# Patient Record
Sex: Male | Born: 1965 | Race: Black or African American | Hispanic: No | Marital: Married | Smoking: Never smoker
Health system: Southern US, Community
[De-identification: ages and names within clinical notes are randomized; demographics above are authoritative.]

---

## 2013-04-04 ENCOUNTER — Ambulatory Visit (INDEPENDENT_AMBULATORY_CARE_PROVIDER_SITE_OTHER): Payer: BC Managed Care – PPO | Admitting: Emergency Medicine

## 2013-04-04 VITALS — BP 112/74 | HR 55 | Temp 98.1°F | Resp 16 | Ht 71.0 in | Wt 182.0 lb

## 2013-04-04 DIAGNOSIS — S335XXA Sprain of ligaments of lumbar spine, initial encounter: Secondary | ICD-10-CM

## 2013-04-04 MED ORDER — NAPROXEN SODIUM 550 MG PO TABS: 550.0000 mg | ORAL_TABLET | Freq: Two times a day (BID) | ORAL | Status: AC

## 2013-04-04 MED ORDER — CYCLOBENZAPRINE HCL 10 MG PO TABS: 10.0000 mg | ORAL_TABLET | Freq: Three times a day (TID) | ORAL | Status: AC | PRN

## 2013-04-04 MED ORDER — HYDROCODONE-ACETAMINOPHEN 5-325 MG PO TABS: 1.0000 | ORAL_TABLET | ORAL | Status: AC | PRN

## 2013-04-04 NOTE — Patient Instructions (Addendum)

## 2013-04-04 NOTE — Progress Notes (Signed)
[facility] [address] [place] [address] [phone]  Date:  04/04/2013   Name:  Edward Vazquez   DOB:  November 14, 1966   MRN:  [id number]  PCP:  Benita Stabile, MD    Chief Complaint: Back Pain   History of Present Illness:  Edward Vazquez is a 47 y.o. 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  Since Monday has experienced pain in his left low back and left leg.  Numbness in left foot, especially great toe.  No history of injury.  No specific overuse.  Now cannot bend over to pull shoes on.  No improvement with over the counter medications or other home remedies. Denies other complaint or health concern today.   There is no problem list on file for this patient.   History reviewed. No pertinent past medical history.  History reviewed. No pertinent past surgical history.  History  Substance Use Topics  . Smoking status: Never Smoker   . Smokeless tobacco: Not on file  . Alcohol Use: No    History reviewed. No pertinent family history.  No Known Allergies  Medication list has been reviewed and updated.  No current outpatient prescriptions on file prior to visit.   No current facility-administered medications on file prior to visit.    Review of Systems:  As per HPI, otherwise negative.    Physical Examination: Filed Vitals:   04/04/13 1217  BP: 112/74  Pulse: 55  Temp: 98.1 F (36.7 C)  Resp: 16   Filed Vitals:   04/04/13 1217  Height: 5\' 11"  (1.803 m)  Weight: 182 lb (82.555 kg)   Body mass index is 25.4 kg/(m^2). Ideal Body Weight: Weight in (lb) to have BMI = 25: 178.9   GEN: WDWN, NAD, Non-toxic, Alert & Oriented x 3 HEENT: Atraumatic, Normocephalic.  Ears and Nose: No external deformity. EXTR: No clubbing/cyanosis/edema NEURO: Normal gait.  PSYCH: Normally interactive. Conversant. Not depressed or anxious appearing.  Calm demeanor.  BACK:  Tender  left lumbar para spinous muscles and left sciatic notch.  Assessment and Plan: Lumbar strain Anaprox Flexeril vicodin   Signed,  Phillips Odor, MD

## 2013-04-08 ENCOUNTER — Telehealth: Payer: Self-pay

## 2013-04-08 NOTE — Telephone Encounter (Signed)
No, he can not take 2 of these one only. He can try heat/ ice /rest with this.

## 2013-04-08 NOTE — Telephone Encounter (Signed)
PT STATES HE WAS GIVEN SOME FLEXERIL AND IT DOESN'T SEEM TO HELP. WOULD LIKE TO KNOW IF HE COULD TAKE 2 AT A TIME PLEASE CALL [phone]

## 2013-11-19 ENCOUNTER — Ambulatory Visit
Admission: RE | Admit: 2013-11-19 | Discharge: 2013-11-19 | Disposition: A | Discharge status: Home / Self Care | Payer: BC Managed Care – PPO | Source: Ambulatory Visit | Attending: Physician Assistant | Admitting: Physician Assistant

## 2013-11-19 ENCOUNTER — Other Ambulatory Visit: Payer: Self-pay | Admitting: Physician Assistant

## 2013-11-19 DIAGNOSIS — M25551 Pain in right hip: Secondary | ICD-10-CM

## 2014-06-26 ENCOUNTER — Other Ambulatory Visit: Payer: Self-pay | Admitting: Physician Assistant

## 2014-06-26 ENCOUNTER — Ambulatory Visit
Admission: RE | Admit: 2014-06-26 | Discharge: 2014-06-26 | Disposition: A | Discharge status: Home / Self Care | Payer: BC Managed Care – PPO | Source: Ambulatory Visit | Attending: Physician Assistant | Admitting: Physician Assistant

## 2014-06-26 DIAGNOSIS — T1490XA Injury, unspecified, initial encounter: Secondary | ICD-10-CM

## 2015-08-18 ENCOUNTER — Other Ambulatory Visit: Payer: Self-pay | Admitting: Physician Assistant

## 2015-08-18 ENCOUNTER — Ambulatory Visit
Admission: RE | Admit: 2015-08-18 | Discharge: 2015-08-18 | Disposition: A | Discharge status: Home / Self Care | Payer: BC Managed Care – PPO | Source: Ambulatory Visit | Attending: Physician Assistant | Admitting: Physician Assistant

## 2015-08-18 DIAGNOSIS — R52 Pain, unspecified: Secondary | ICD-10-CM

## 2016-06-08 ENCOUNTER — Ambulatory Visit
Admission: RE | Admit: 2016-06-08 | Discharge: 2016-06-08 | Disposition: A | Discharge status: Home / Self Care | Payer: BC Managed Care – PPO | Source: Ambulatory Visit | Attending: Physician Assistant | Admitting: Physician Assistant

## 2016-06-08 ENCOUNTER — Other Ambulatory Visit: Payer: Self-pay | Admitting: Physician Assistant

## 2016-06-08 DIAGNOSIS — T1490XA Injury, unspecified, initial encounter: Secondary | ICD-10-CM

## 2016-08-02 ENCOUNTER — Encounter (HOSPITAL_BASED_OUTPATIENT_CLINIC_OR_DEPARTMENT_OTHER): Payer: Self-pay | Admitting: *Deleted

## 2016-08-02 ENCOUNTER — Emergency Department (HOSPITAL_BASED_OUTPATIENT_CLINIC_OR_DEPARTMENT_OTHER)
Admission: EM | Admit: 2016-08-02 | Discharge: 2016-08-03 | Disposition: A | Discharge status: Home / Self Care | Payer: BC Managed Care – PPO | Attending: Emergency Medicine | Admitting: Emergency Medicine

## 2016-08-02 DIAGNOSIS — S86802A Unspecified injury of other muscle(s) and tendon(s) at lower leg level, left leg, initial encounter: Secondary | ICD-10-CM | POA: Diagnosis not present

## 2016-08-02 DIAGNOSIS — Y999 Unspecified external cause status: Secondary | ICD-10-CM | POA: Diagnosis not present

## 2016-08-02 DIAGNOSIS — S8992XA Unspecified injury of left lower leg, initial encounter: Secondary | ICD-10-CM | POA: Diagnosis present

## 2016-08-02 DIAGNOSIS — W2103XA Struck by baseball, initial encounter: Secondary | ICD-10-CM | POA: Insufficient documentation

## 2016-08-02 DIAGNOSIS — Y9364 Activity, baseball: Secondary | ICD-10-CM | POA: Diagnosis not present

## 2016-08-02 DIAGNOSIS — Y929 Unspecified place or not applicable: Secondary | ICD-10-CM | POA: Diagnosis not present

## 2016-08-02 DIAGNOSIS — IMO0001 Reserved for inherently not codable concepts without codable children: Secondary | ICD-10-CM

## 2016-08-02 NOTE — ED Triage Notes (Signed)
He was running while playing baseball tonight. He felt a pop and his left calf causing him to fall. Swelling and pain.

## 2016-08-03 MED ORDER — KETOROLAC TROMETHAMINE 60 MG/2ML IM SOLN
60.0000 mg | Freq: Once | INTRAMUSCULAR | Status: AC
  Administered 2016-08-03: 60 mg via INTRAMUSCULAR
  Filled 2016-08-03: qty 2

## 2016-08-03 MED ORDER — METHOCARBAMOL 500 MG PO TABS
1000.0000 mg | ORAL_TABLET | Freq: Once | ORAL | Status: AC
  Administered 2016-08-03: 1000 mg via ORAL
  Filled 2016-08-03: qty 2

## 2016-08-03 MED ORDER — METHOCARBAMOL 500 MG PO TABS: 500.0000 mg | ORAL_TABLET | Freq: Two times a day (BID) | ORAL | 0 refills | Status: AC

## 2016-08-03 MED ORDER — NAPROXEN 500 MG PO TABS: 500.0000 mg | ORAL_TABLET | Freq: Two times a day (BID) | ORAL | 0 refills | Status: AC

## 2016-08-03 MED ORDER — OXYCODONE-ACETAMINOPHEN 5-325 MG PO TABS
2.0000 | ORAL_TABLET | Freq: Once | ORAL | Status: AC
  Administered 2016-08-03: 2 via ORAL
  Filled 2016-08-03: qty 2

## 2016-08-03 MED ORDER — OXYCODONE-ACETAMINOPHEN 5-325 MG PO TABS: 1.0000 | ORAL_TABLET | Freq: Four times a day (QID) | ORAL | 0 refills | Status: AC | PRN

## 2016-08-03 NOTE — Discharge Instructions (Signed)
You have been seen today for a lower leg injury. We suspect that you have sustained a rupture in one of the small, unused muscles in the lower leg called the plantaris longus. Use the crutches as needed for comfort. Elevate the extremity whenever possible. Apply ice and use ibuprofen or naproxen to reduce inflammation.  Follow-up with orthopedics should symptoms fail to resolve. Return to ED as needed.

## 2016-08-03 NOTE — ED Provider Notes (Signed)
[facility] Note   CSN: [id number] Arrival date & time: 08/02/16  2154     History   Chief Complaint Chief Complaint  Patient presents with  . Leg Injury    HPI Edward QuintCurtis D Mullen is a 50 y.o. male.  HPI   Edward Vazquez is a 50 y.o. male, patient with no pertinent past medical history, presenting to the ED with Left lower leg injury that occurred just prior to arrival. Patient states he was playing baseball, pushed off from the base, and heard a pop in his left lower leg. Patient has pain in the same. Rates it moderate, achy and throbbing, nonradiating. Patient can still bear weight, but with pain. Denies loss of function, neuro deficits, head injury, or any other complaints or injuries.    History reviewed. No pertinent past medical history.  There are no active problems to display for this patient.   History reviewed. No pertinent surgical history.     Home Medications    Prior to Admission medications   Medication Sig Start Date End Date Taking? Authorizing Provider  cyclobenzaprine (FLEXERIL) 10 MG tablet Take 1 tablet (10 mg total) by mouth 3 (three) times daily as needed for muscle spasms. 04/04/13   Carmelina DaneJeffery S Anderson, MD  HYDROcodone-acetaminophen (NORCO) 5-325 MG per tablet Take 1 tablet by mouth every 4 (four) hours as needed. 04/04/13   Carmelina DaneJeffery S Anderson, MD  methocarbamol (ROBAXIN) 500 MG tablet Take 1 tablet (500 mg total) by mouth 2 (two) times daily. 08/03/16   Moshe Wenger C Danique Hartsough, PA-C  naproxen (NAPROSYN) 500 MG tablet Take 1 tablet (500 mg total) by mouth 2 (two) times daily. 08/03/16   Maxene Byington C Oline Belk, PA-C  oxyCODONE-acetaminophen (PERCOCET/ROXICET) 5-325 MG tablet Take 1-2 tablets by mouth every 6 (six) hours as needed for severe pain. 08/03/16   Anselm PancoastShawn C Casaundra Takacs, PA-C    Family History No family history on file.  Social History Social History  Substance Use Topics  . Smoking status: Never Smoker  . Smokeless tobacco: Never Used  .  Alcohol use No     Allergies   Review of patient's allergies indicates no known allergies.   Review of Systems Review of Systems  Musculoskeletal: Positive for myalgias. Negative for arthralgias and joint swelling.  Neurological: Negative for weakness and numbness.     Physical Exam Updated Vital Signs BP 102/77   Pulse 97   Temp 98.9 F (37.2 C) (Oral)   Resp 20   Ht 6' (1.829 m)   Wt 93 kg   SpO2 100%   BMI 27.80 kg/m   Physical Exam  Constitutional: He appears well-developed and well-nourished. No distress.  HENT:  Head: Normocephalic and atraumatic.  Eyes: Conjunctivae are normal.  Neck: Neck supple.  Cardiovascular: Normal rate and regular rhythm.   Pulmonary/Chest: Effort normal.  Musculoskeletal:  Tenderness and swelling to the left posterior lower leg. Patient has full range of motion in the bilateral knees and ankles. Achilles tendon appears to be intact.  Neurological: He is alert.  Skin: Skin is warm and dry. He is not diaphoretic.  Psychiatric: He has a normal mood and affect. His behavior is normal.  Nursing note and vitals reviewed.    ED Treatments / Results  Labs (all labs ordered are listed, but only abnormal results are displayed) Labs Reviewed - No data to display  EKG  EKG Interpretation None       Radiology No results found.  Procedures Procedures (including critical  care time)  Medications Ordered in ED Medications  oxyCODONE-acetaminophen (PERCOCET/ROXICET) 5-325 MG per tablet 2 tablet (2 tablets Oral Given 08/03/16 0007)  methocarbamol (ROBAXIN) tablet 1,000 mg (1,000 mg Oral Given 08/03/16 0008)  ketorolac (TORADOL) injection 60 mg (60 mg Intramuscular Given 08/03/16 0010)     Initial Impression / Assessment and Plan / ED Course  I have reviewed the triage vital signs and the nursing notes.  Pertinent labs & imaging results that were available during my care of the patient were reviewed by me and considered in my medical  decision making (see chart for details).  Clinical Course    Edward QuintCurtis D Gheen presents with a left lower leg injury that occurred just prior to arrival.  Findings and plan of care discussed with Paula LibraJohn Molpus, MD. Dr. Read DriversMolpus personally evaluated and examined this patient.  Suspect plantaris longus rupture. Treatment is symptomatic care. Follow up with orthopedics as needed. The patient was given instructions for home care as well as return precautions. Patient voices understanding of these instructions, accepts the plan, and is comfortable with discharge.   Vitals:   08/02/16 2202 08/02/16 2203 08/03/16 0033  BP:  102/77 121/88  Pulse:  97 62  Resp:  20 20  Temp:  98.9 F (37.2 C)   TempSrc:  Oral   SpO2:  100% 100%  Weight: 93 kg    Height: 6' (1.829 m)       Final Clinical Impressions(s) / ED Diagnoses   Final diagnoses:  Injury of plantaris muscle or tendon, left, initial encounter    New Prescriptions Discharge Medication List as of 08/03/2016 12:27 AM    START taking these medications   Details  methocarbamol (ROBAXIN) 500 MG tablet Take 1 tablet (500 mg total) by mouth 2 (two) times daily., Starting Wed 08/03/2016, Print    naproxen (NAPROSYN) 500 MG tablet Take 1 tablet (500 mg total) by mouth 2 (two) times daily., Starting Wed 08/03/2016, Print    oxyCODONE-acetaminophen (PERCOCET/ROXICET) 5-325 MG tablet Take 1-2 tablets by mouth every 6 (six) hours as needed for severe pain., Starting Wed 08/03/2016, Print         Anselm PancoastShawn C Wilfrido Luedke, PA-C 08/03/16 0211    Paula LibraJohn Molpus, MD 08/03/16 [phone]

## 2017-11-27 IMAGING — CR DG ANKLE COMPLETE 3+V*R*
3 series · 3 of 3 positions shown · non-contrast
Comparison: 06/26/2014 .

CLINICAL DATA: Right ankle injury.  Pain swelling.

EXAM:
RIGHT ANKLE - COMPLETE 3+ VIEW

[view not recorded (1 of 3)]
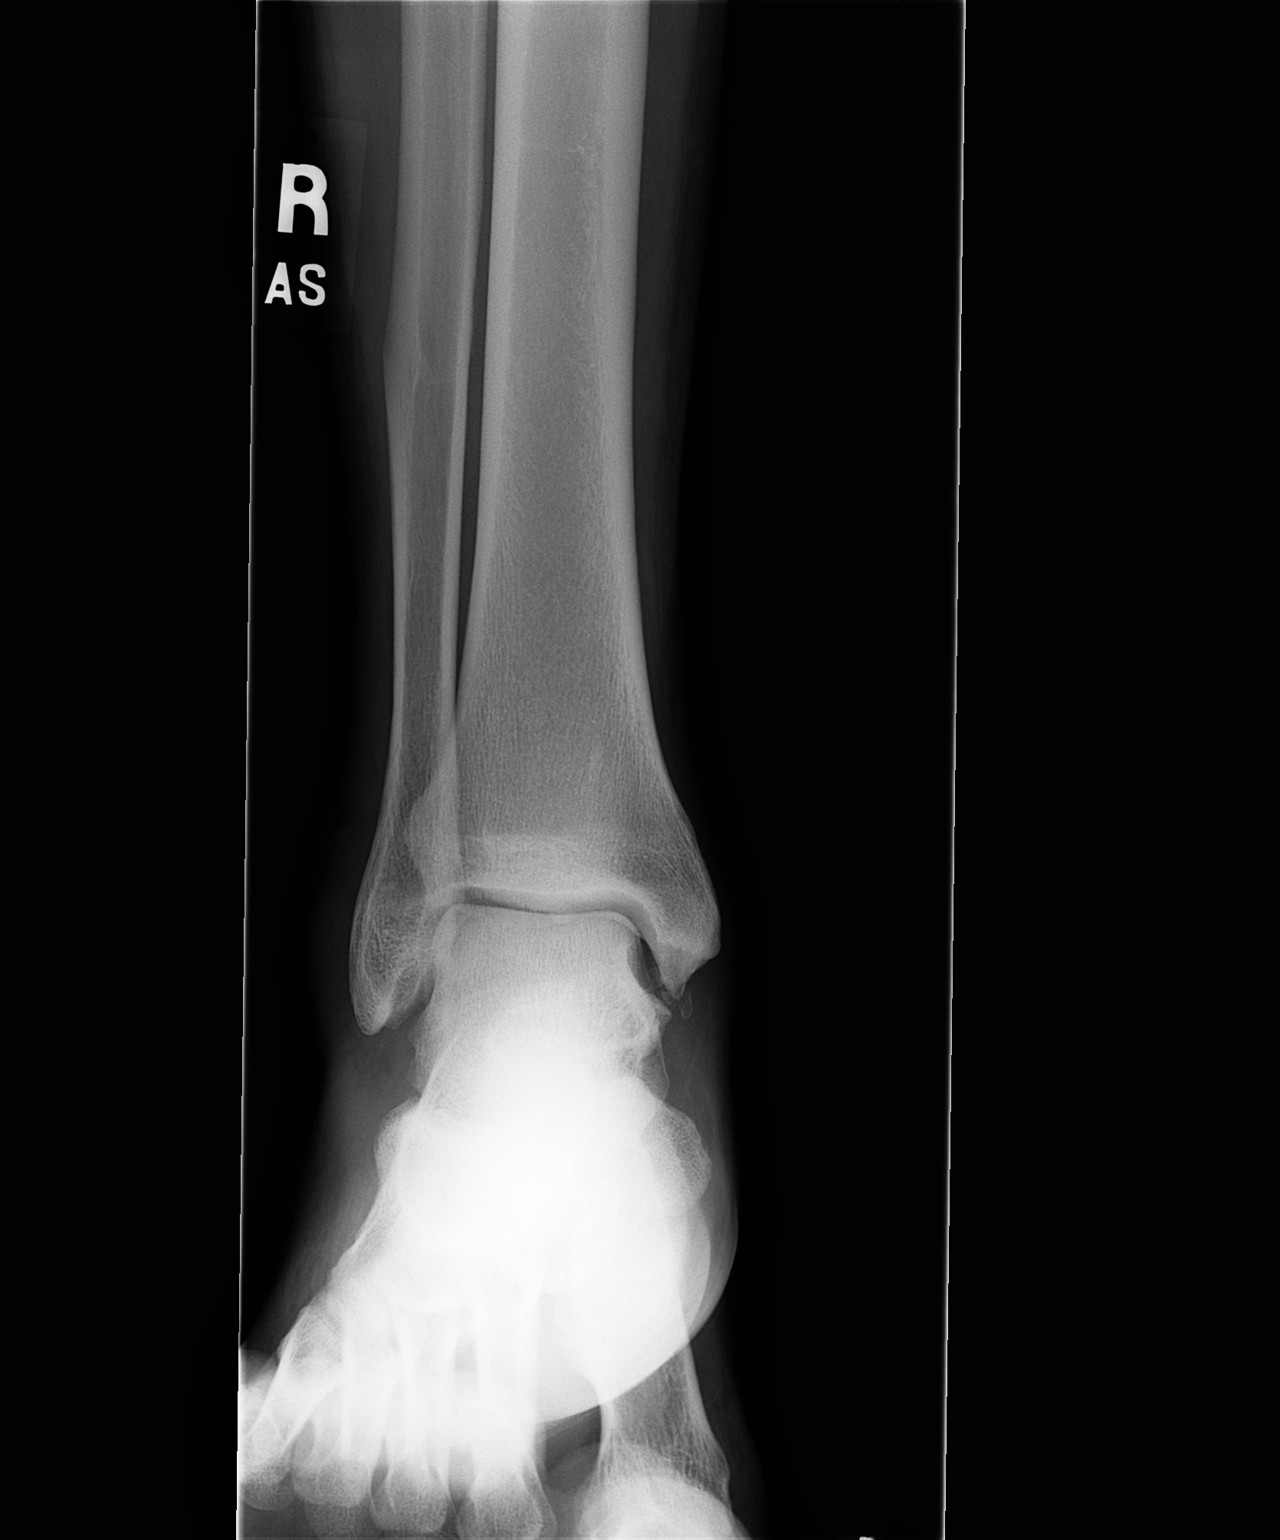

[view not recorded (2 of 3)]
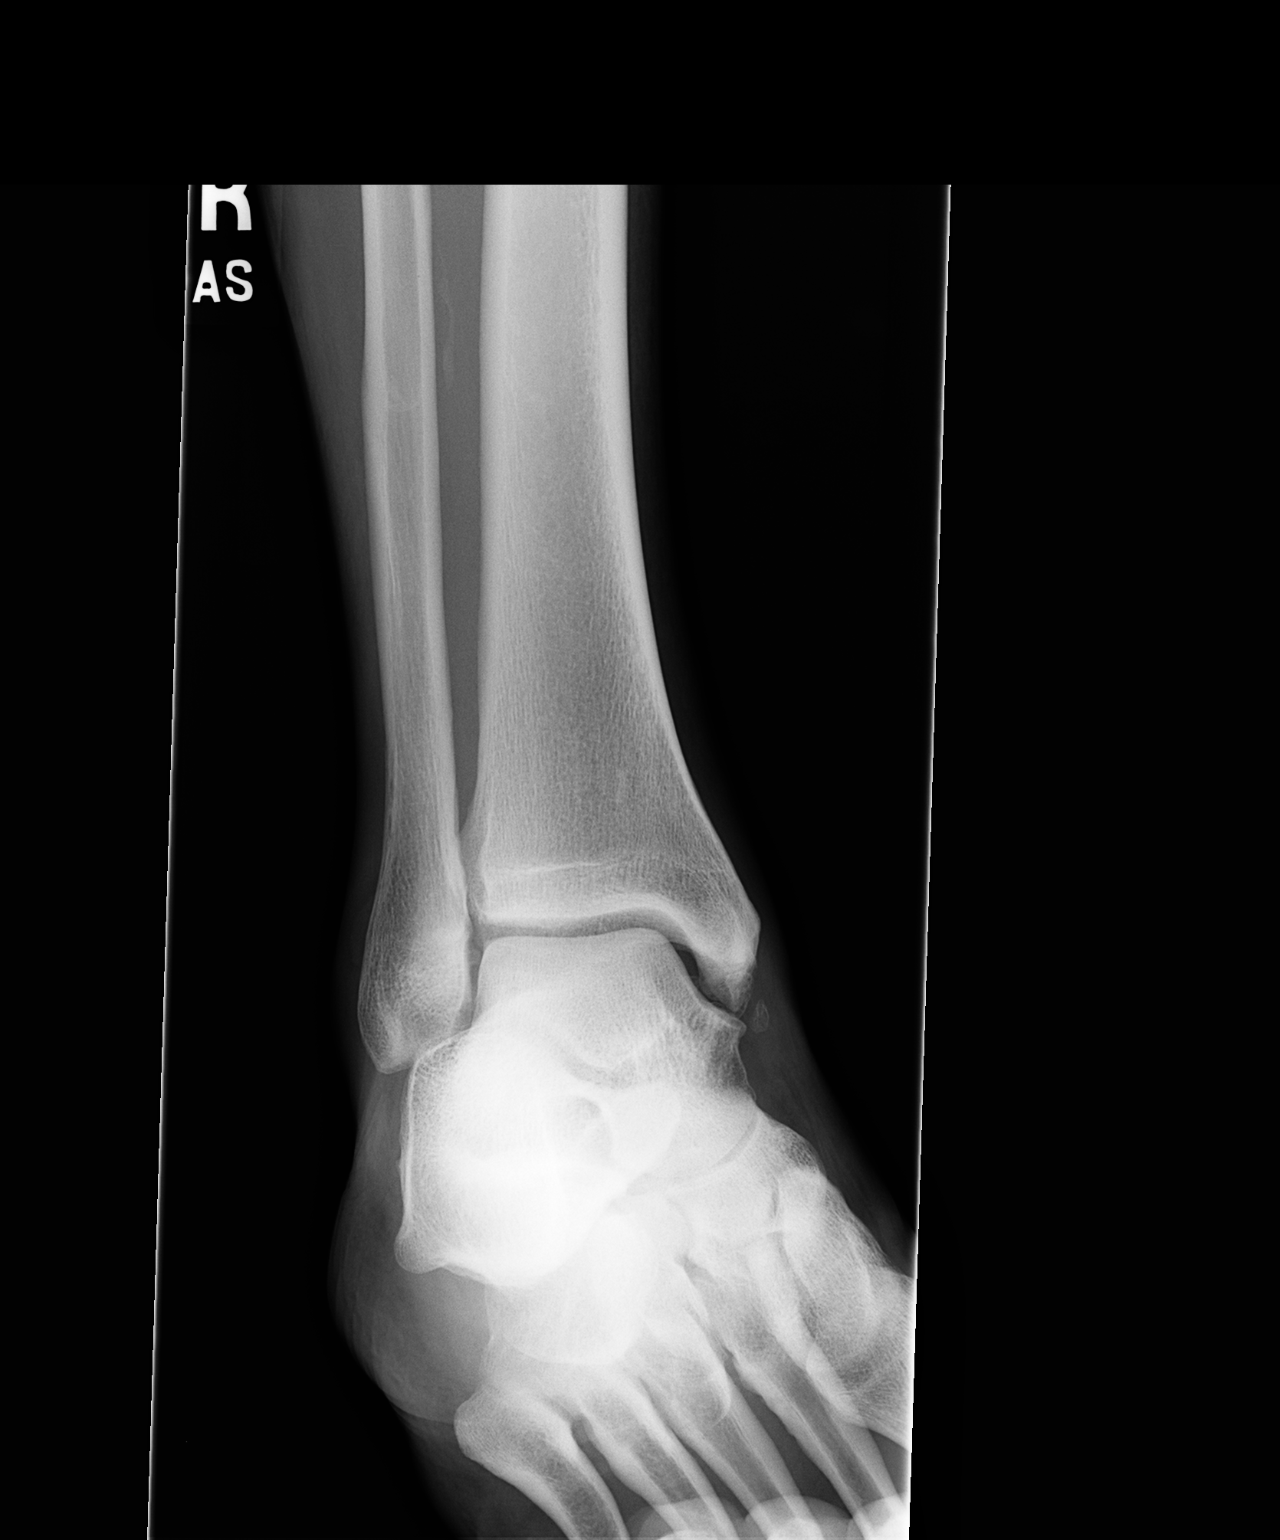

[view not recorded (3 of 3)]
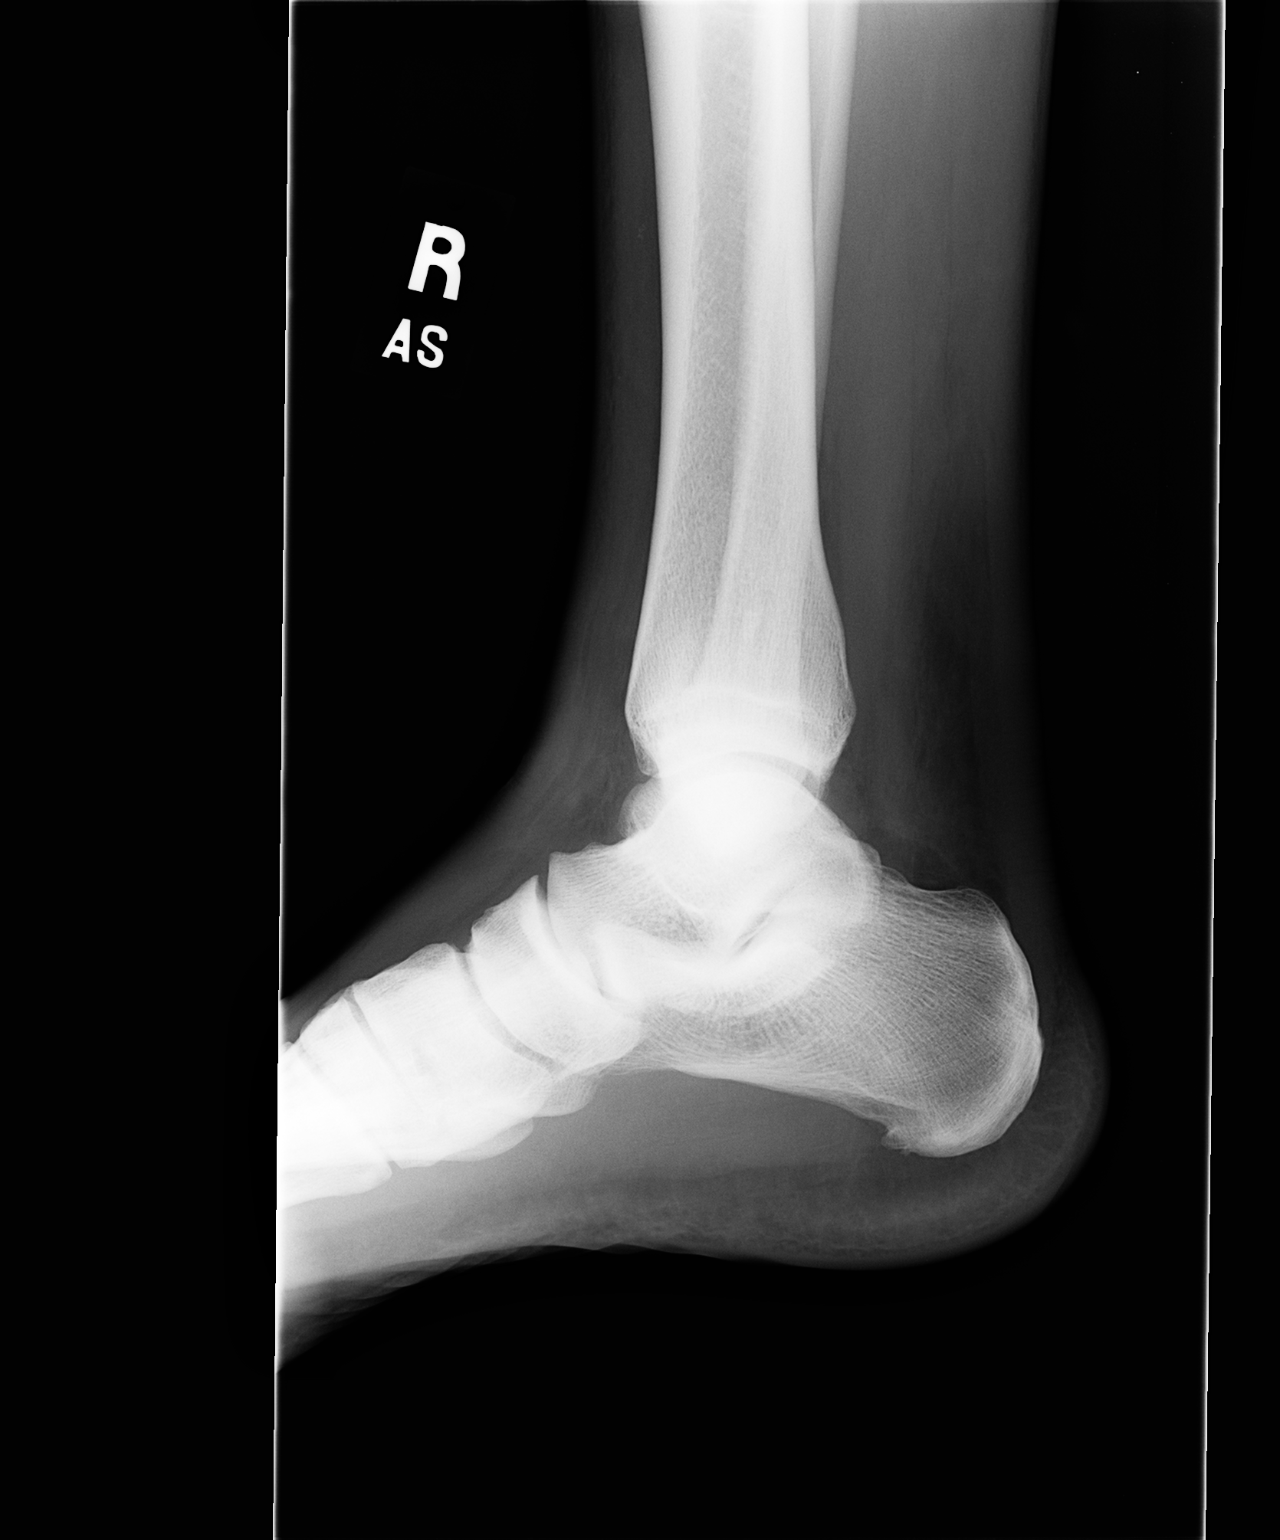

[3 of 3 positions shown; findings below may reference images not displayed]

FINDINGS: No acute bony or joint abnormality identified. Degenerative changes
noted about the right ankle. Tiny bony densities noted adjacent to
the medial malleolus most likely old fracture fragments .
IMPRESSION: No acute bony or joint abnormality identified. Tiny bony densities
noted adjacent to the medial malleolus most likely old fracture
fragments.

## 2021-09-24 ENCOUNTER — Other Ambulatory Visit: Payer: Self-pay | Admitting: Sports Medicine

## 2021-09-24 ENCOUNTER — Ambulatory Visit
Admission: RE | Admit: 2021-09-24 | Discharge: 2021-09-24 | Disposition: A | Discharge status: Home / Self Care | Payer: Self-pay | Source: Ambulatory Visit | Attending: Sports Medicine | Admitting: Sports Medicine

## 2021-09-24 DIAGNOSIS — M25512 Pain in left shoulder: Secondary | ICD-10-CM

## 2021-10-23 ENCOUNTER — Ambulatory Visit
Admission: RE | Admit: 2021-10-23 | Discharge: 2021-10-23 | Disposition: A | Discharge status: Home / Self Care | Payer: BC Managed Care – PPO | Source: Ambulatory Visit | Attending: Sports Medicine | Admitting: Sports Medicine

## 2021-10-23 DIAGNOSIS — M25512 Pain in left shoulder: Secondary | ICD-10-CM

## 2023-03-15 IMAGING — CR DG SHOULDER 2+V*L*
3 series · 3 of 3 positions shown · non-contrast
Comparison: None.

CLINICAL DATA: Lateral shoulder pain with limited range of motion
falling injury 2 weeks ago.

EXAM:
LEFT SHOULDER - 2+ VIEW

[w shoulder ap internal left]
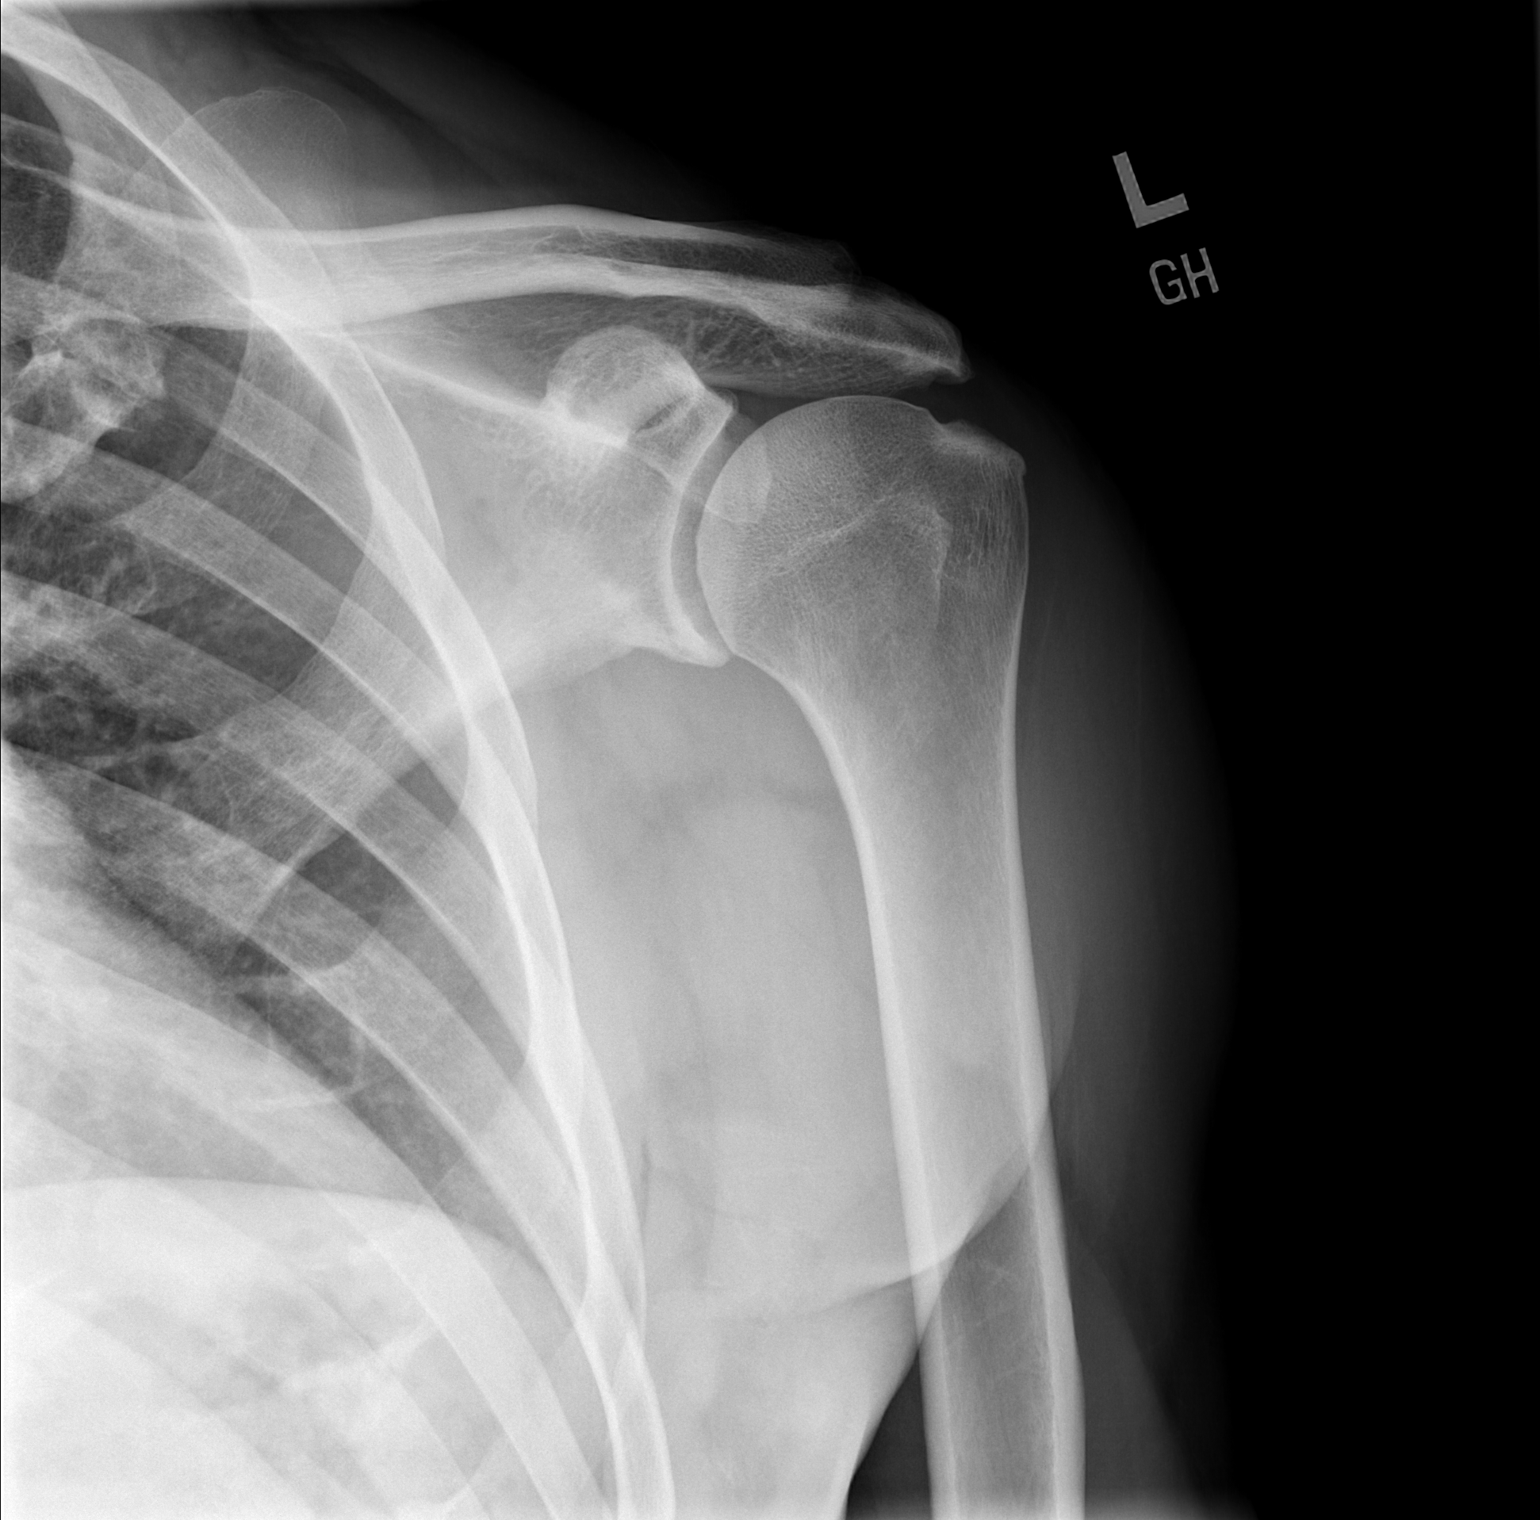

[w shoulder y view left]
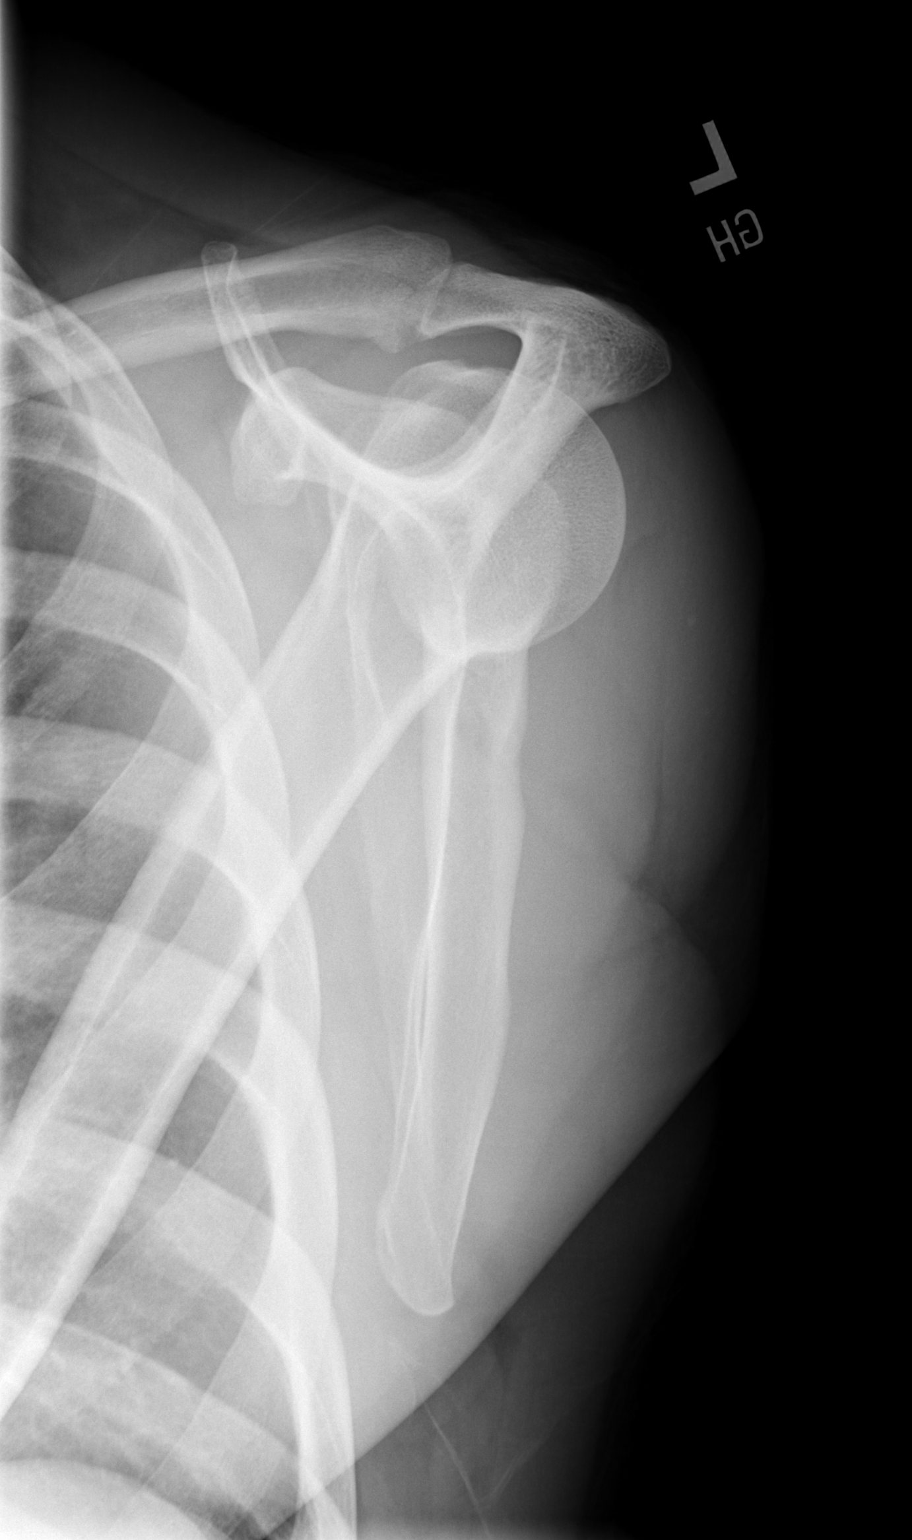

[w shoulder axillary left *]
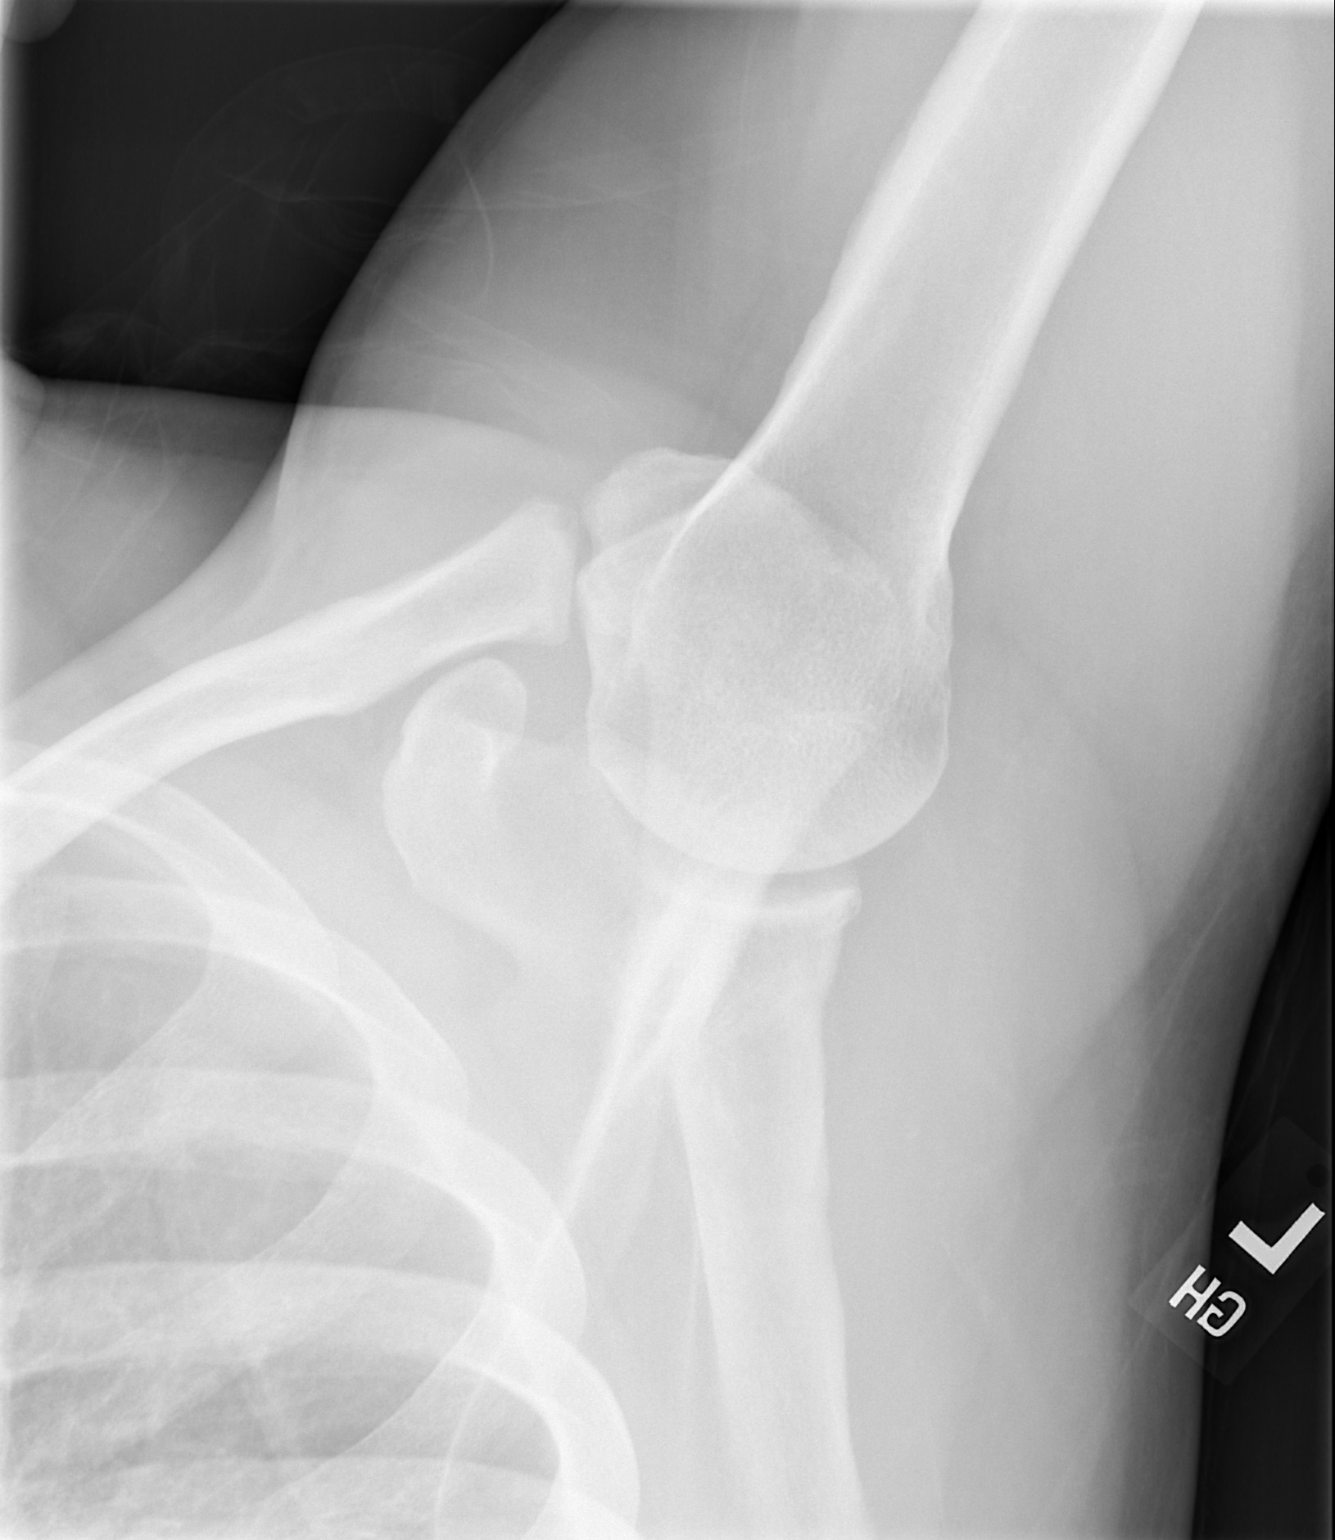

[3 of 3 positions shown; findings below may reference images not displayed]

FINDINGS: The mineralization and alignment are normal. There is no evidence of
acute fracture or dislocation. There are mild acromioclavicular
degenerative changes with possible resulting rotator cuff
impingement. Minimal glenohumeral degenerative changes.
IMPRESSION: No evidence of acute fracture or dislocation. Mild acromioclavicular
and glenohumeral degenerative changes.
